# Patient Record
Sex: Female | Born: 1970 | Race: White | Hispanic: No | Marital: Married | Smoking: Current every day smoker
Health system: Southern US, Community
[De-identification: ages and names within clinical notes are randomized; demographics above are authoritative.]

## PROBLEM LIST (undated history)

## (undated) DIAGNOSIS — I639 Cerebral infarction, unspecified: Secondary | ICD-10-CM

---

## 1998-01-10 ENCOUNTER — Encounter: Admission: RE | Admit: 1998-01-10 | Discharge: 1998-01-10 | Payer: Self-pay | Admitting: Family Medicine

## 1999-06-11 ENCOUNTER — Other Ambulatory Visit: Admission: RE | Admit: 1999-06-11 | Discharge: 1999-06-11 | Payer: Self-pay | Admitting: Gynecology

## 1999-06-12 ENCOUNTER — Ambulatory Visit (HOSPITAL_COMMUNITY): Admission: RE | Admit: 1999-06-12 | Discharge: 1999-06-12 | Payer: Self-pay | Admitting: Gynecology

## 1999-06-12 ENCOUNTER — Encounter (INDEPENDENT_AMBULATORY_CARE_PROVIDER_SITE_OTHER): Payer: Self-pay

## 2000-05-25 ENCOUNTER — Inpatient Hospital Stay (HOSPITAL_COMMUNITY): Admission: AD | Admit: 2000-05-25 | Discharge: 2000-05-25 | Payer: Self-pay | Admitting: *Deleted

## 2000-05-30 ENCOUNTER — Inpatient Hospital Stay (HOSPITAL_COMMUNITY): Admission: AD | Admit: 2000-05-30 | Discharge: 2000-05-30 | Payer: Self-pay | Admitting: Gynecology

## 2000-06-04 ENCOUNTER — Inpatient Hospital Stay (HOSPITAL_COMMUNITY): Admission: AD | Admit: 2000-06-04 | Discharge: 2000-06-06 | Payer: Self-pay | Admitting: Gynecology

## 2000-06-18 ENCOUNTER — Other Ambulatory Visit: Admission: RE | Admit: 2000-06-18 | Discharge: 2000-06-18 | Payer: Self-pay | Admitting: Gynecology

## 2000-06-18 ENCOUNTER — Encounter (INDEPENDENT_AMBULATORY_CARE_PROVIDER_SITE_OTHER): Payer: Self-pay

## 2000-07-20 ENCOUNTER — Other Ambulatory Visit: Admission: RE | Admit: 2000-07-20 | Discharge: 2000-07-20 | Payer: Self-pay | Admitting: Gynecology

## 2001-11-08 ENCOUNTER — Other Ambulatory Visit: Admission: RE | Admit: 2001-11-08 | Discharge: 2001-11-08 | Payer: Self-pay | Admitting: Gynecology

## 2002-11-14 ENCOUNTER — Other Ambulatory Visit: Admission: RE | Admit: 2002-11-14 | Discharge: 2002-11-14 | Payer: Self-pay | Admitting: Gynecology

## 2004-01-12 ENCOUNTER — Other Ambulatory Visit: Admission: RE | Admit: 2004-01-12 | Discharge: 2004-01-12 | Payer: Self-pay | Admitting: Gynecology

## 2007-10-11 ENCOUNTER — Ambulatory Visit (HOSPITAL_COMMUNITY): Admission: RE | Admit: 2007-10-11 | Discharge: 2007-10-11 | Payer: Self-pay | Admitting: Obstetrics and Gynecology

## 2007-10-11 ENCOUNTER — Other Ambulatory Visit: Admission: RE | Admit: 2007-10-11 | Discharge: 2007-10-11 | Payer: Self-pay | Admitting: Obstetrics and Gynecology

## 2008-02-05 ENCOUNTER — Inpatient Hospital Stay (HOSPITAL_COMMUNITY): Admission: AD | Admit: 2008-02-05 | Discharge: 2008-02-05 | Payer: Self-pay | Admitting: Obstetrics and Gynecology

## 2008-03-24 ENCOUNTER — Inpatient Hospital Stay (HOSPITAL_COMMUNITY): Admission: AD | Admit: 2008-03-24 | Discharge: 2008-03-25 | Payer: Self-pay | Admitting: Obstetrics and Gynecology

## 2008-04-17 ENCOUNTER — Inpatient Hospital Stay (HOSPITAL_COMMUNITY): Admission: AD | Admit: 2008-04-17 | Discharge: 2008-04-20 | Payer: Self-pay | Admitting: Obstetrics and Gynecology

## 2008-05-12 ENCOUNTER — Inpatient Hospital Stay (HOSPITAL_COMMUNITY): Admission: AD | Admit: 2008-05-12 | Discharge: 2008-05-15 | Payer: Self-pay | Admitting: Obstetrics and Gynecology

## 2008-08-21 ENCOUNTER — Emergency Department (HOSPITAL_COMMUNITY): Admission: EM | Admit: 2008-08-21 | Discharge: 2008-08-21 | Payer: Self-pay | Admitting: Emergency Medicine

## 2008-08-24 ENCOUNTER — Encounter (HOSPITAL_COMMUNITY): Admission: RE | Admit: 2008-08-24 | Discharge: 2008-11-03 | Payer: Self-pay | Admitting: Emergency Medicine

## 2009-12-28 IMAGING — US US OB FOLLOW-UP
1 series · 14 of 28 positions shown · non-contrast
Comparison: none

OBSTETRICAL ULTRASOUND:
 This ultrasound exam was performed in the [HOSPITAL] Ultrasound Department.  The OB US report was generated in the AS system, and faxed to the ordering physician.  This report is also available in [REDACTED] PACS.

[Series 1: us fetal bpp w/o ns modify · non-contrast · 14 of 48 slices shown]
[im 2/48]
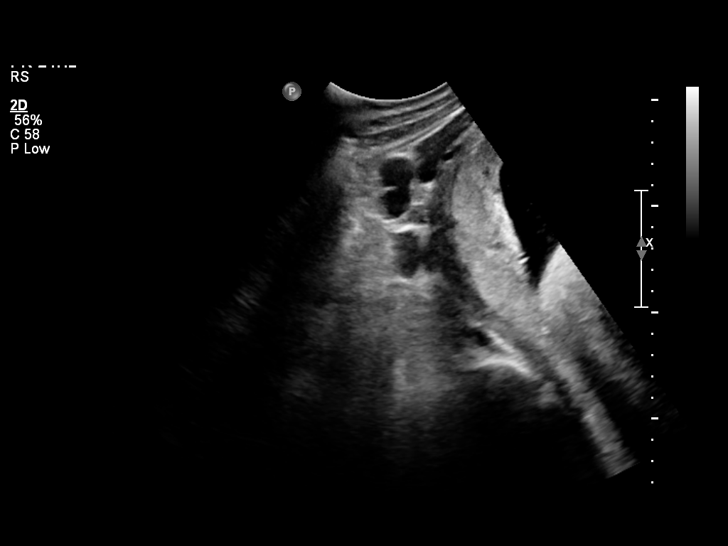
[im 6/48]
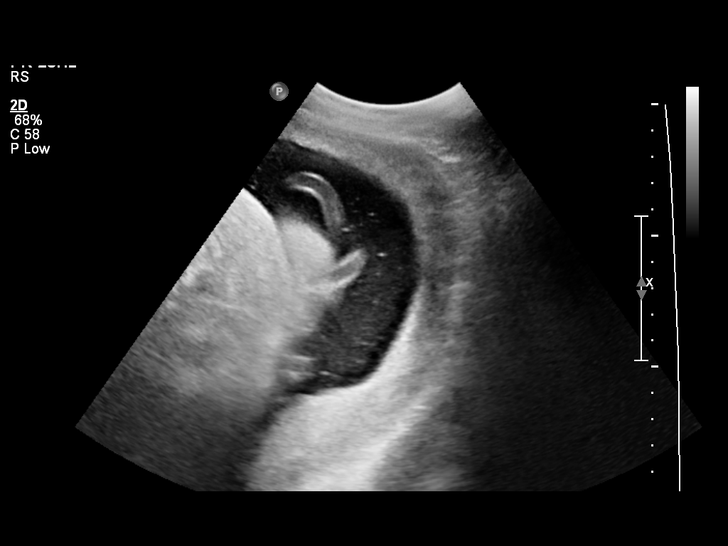
[im 9/48]
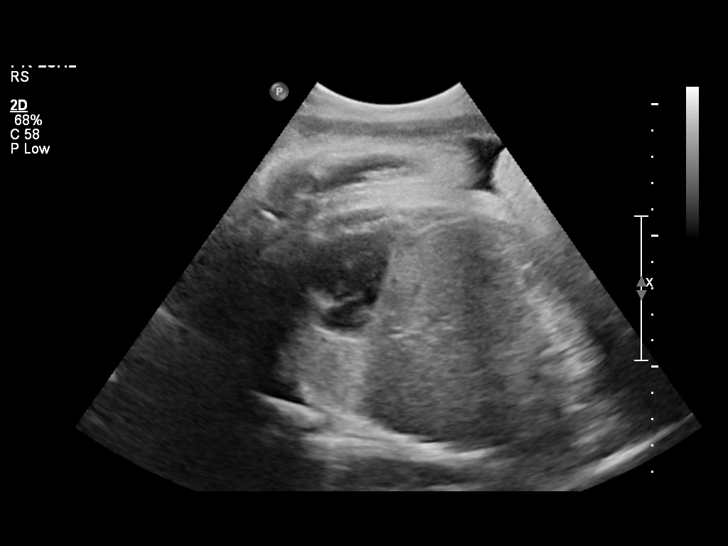
[im 13/48]
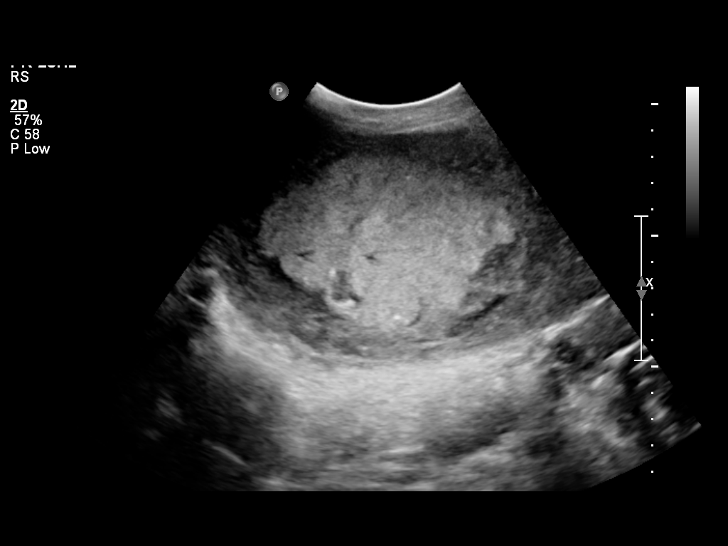
[im 16/48]
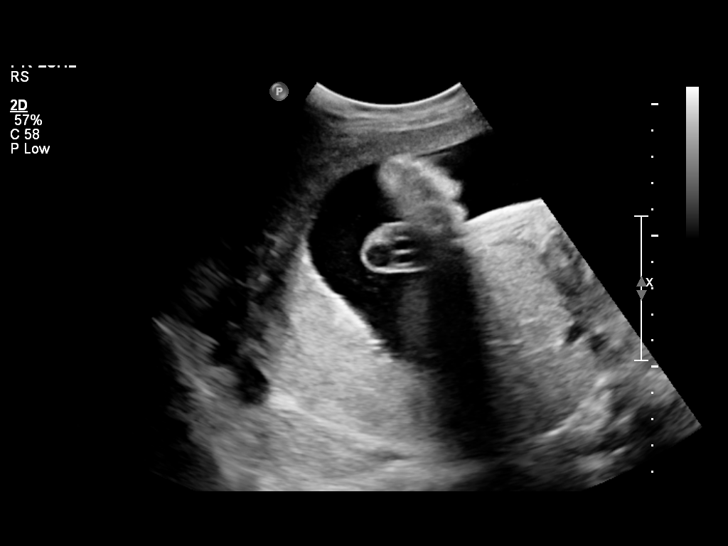
[im 20/48]
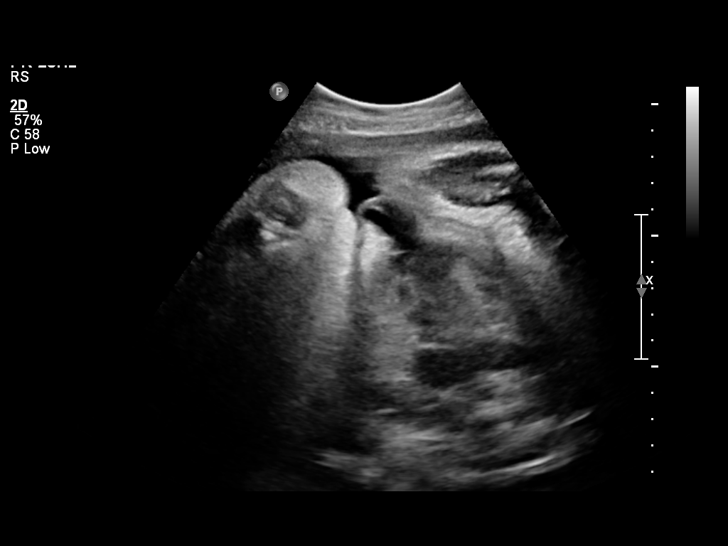
[im 23/48]
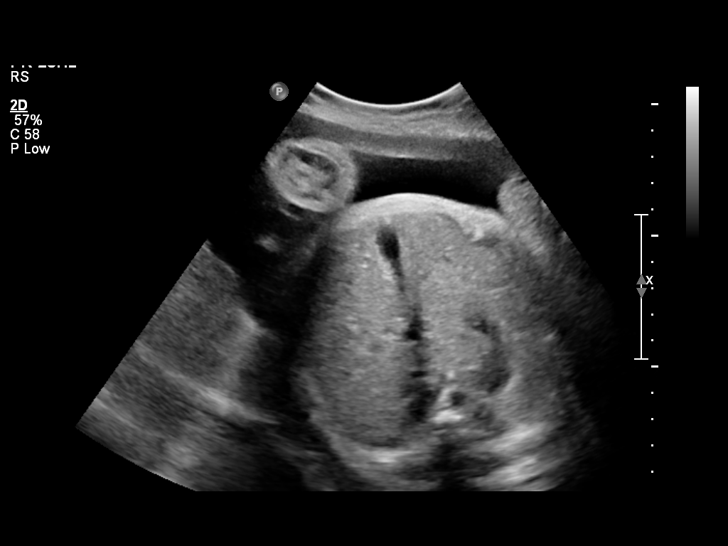
[im 27/48]
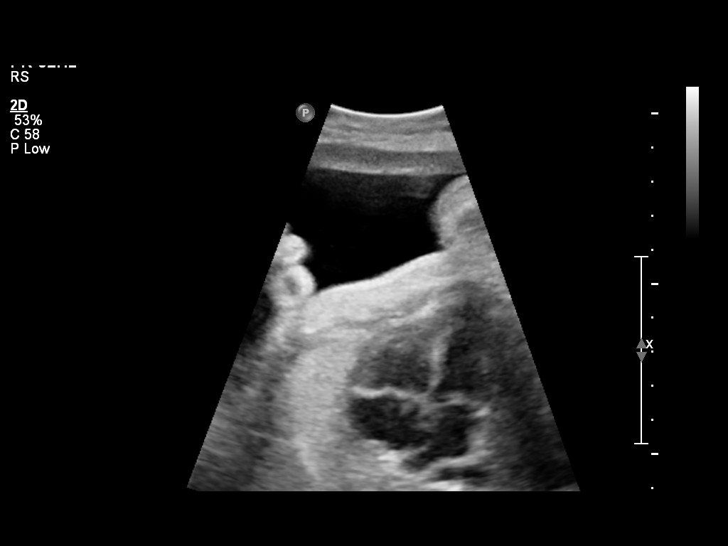
[im 30/48]
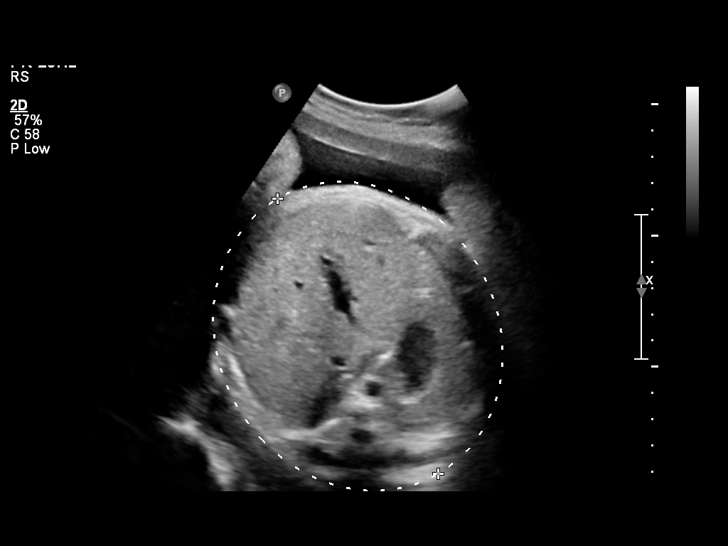
[im 34/48]
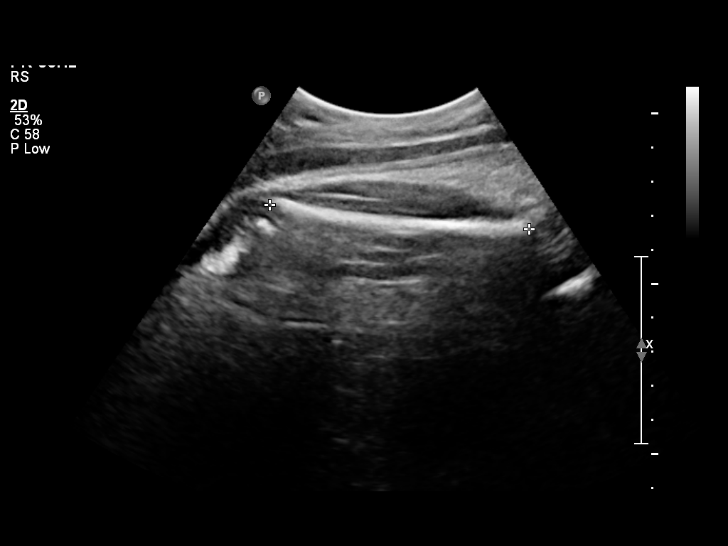
[im 37/48]
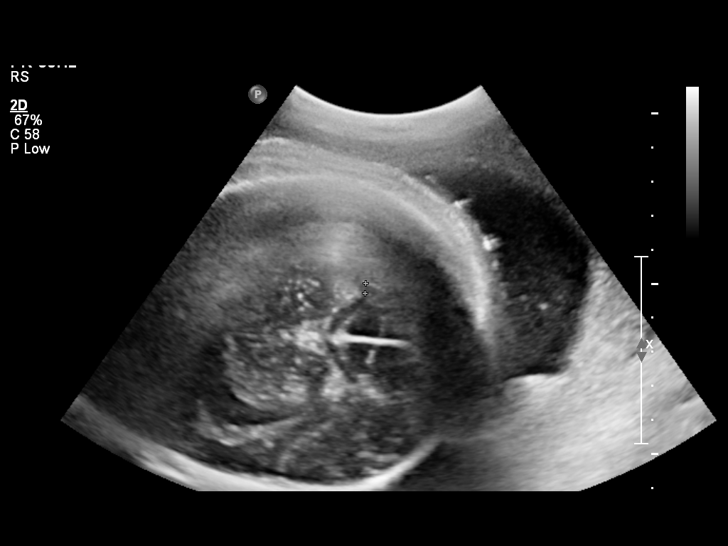
[im 41/48]
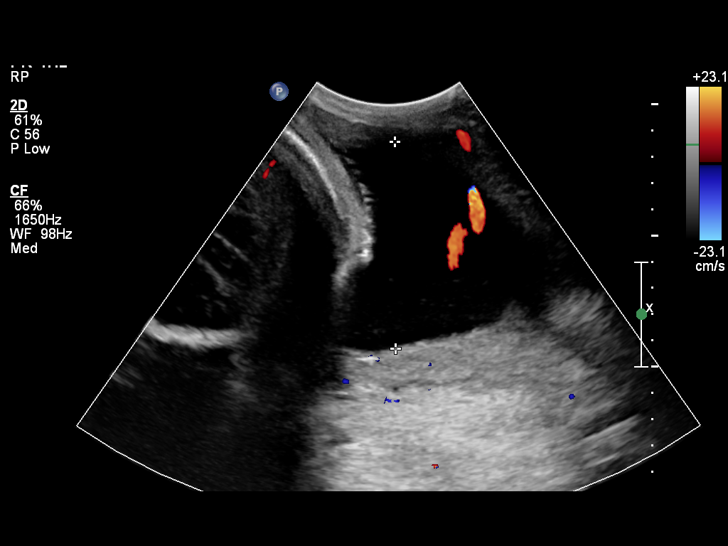
[im 44/48]
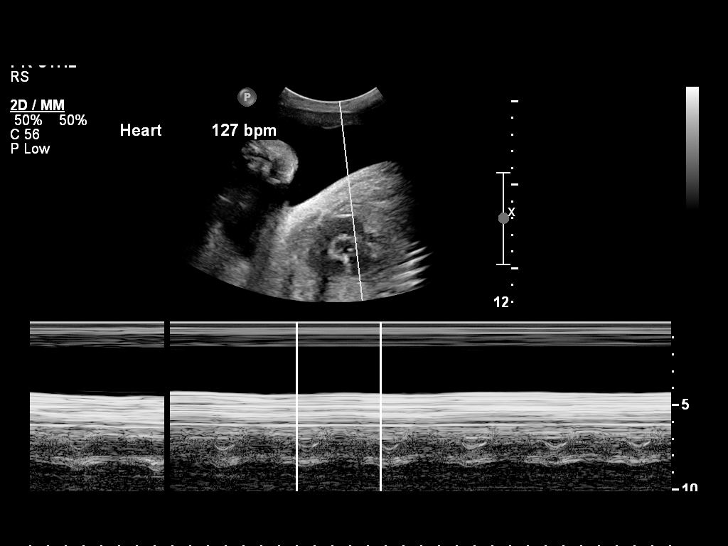
[im 48/48]
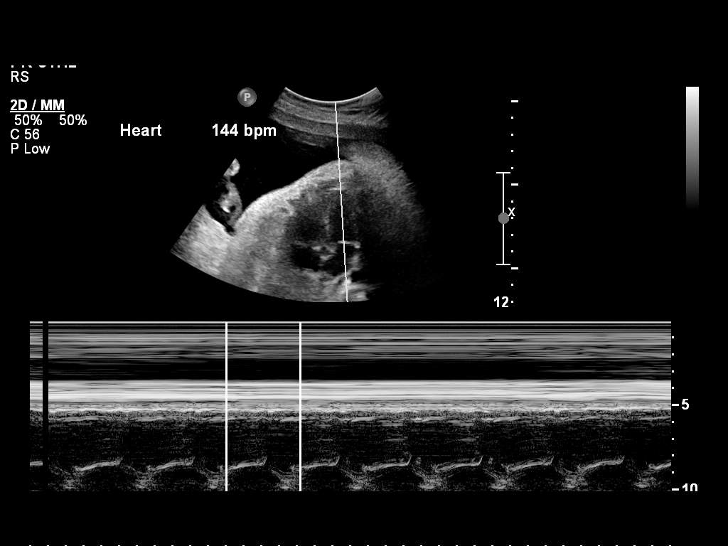

[14 of 28 positions shown; findings below may reference images not displayed]

IMPRESSION: See AS Obstetric US report.

## 2009-12-29 IMAGING — US US RENAL
1 series · 14 of 25 positions shown · non-contrast
Comparison: None

CLINICAL DATA: Pyelonephritis and flank pain

RENAL/URINARY TRACT ULTRASOUND
TECHNIQUE: Complete ultrasound examination of the urinary tract
was performed including evaluation of the kidneys, renal collecting
systems, and urinary bladder.

[Series 1: us renal · 14 of 70 slices shown]
[im 1/70]
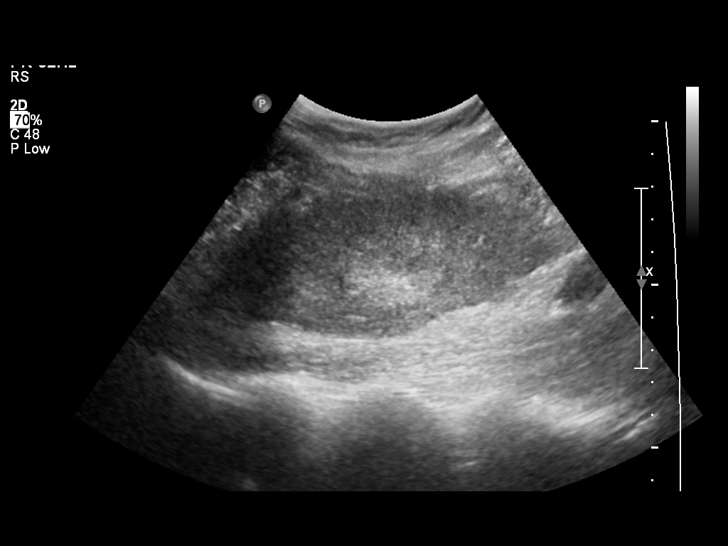
[im 6/70]
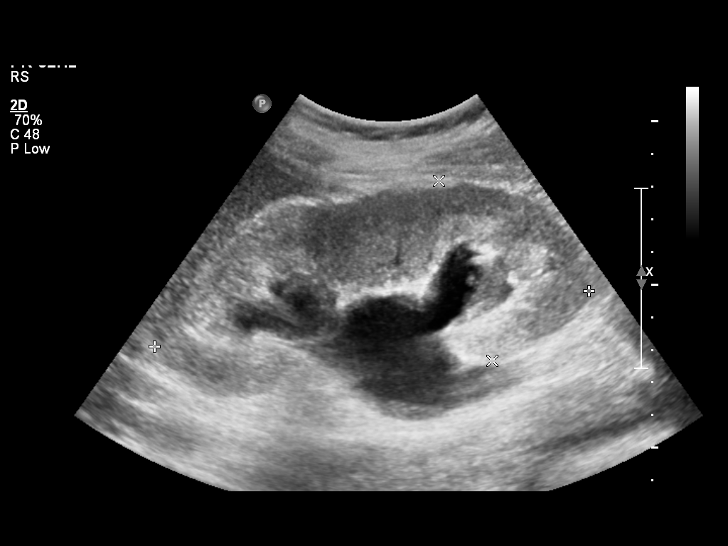
[im 12/70]
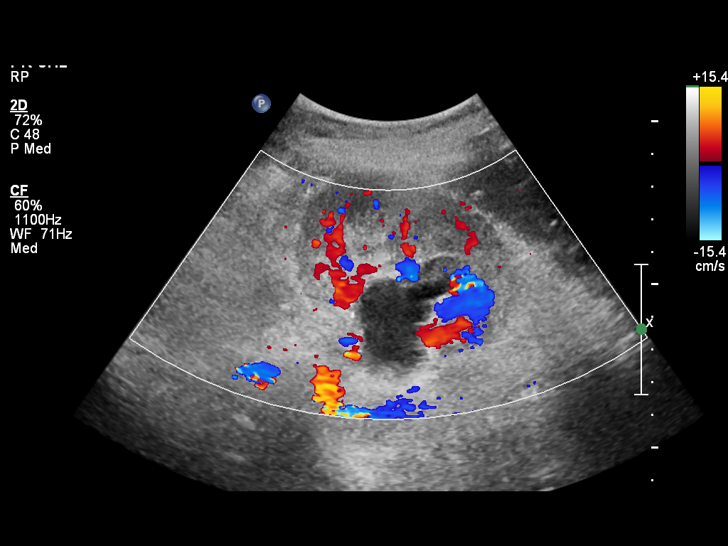
[im 18/70]
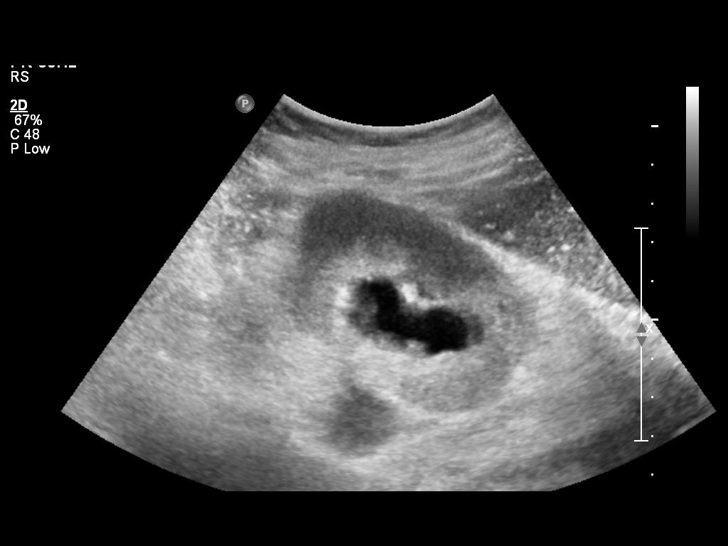
[im 24/70]
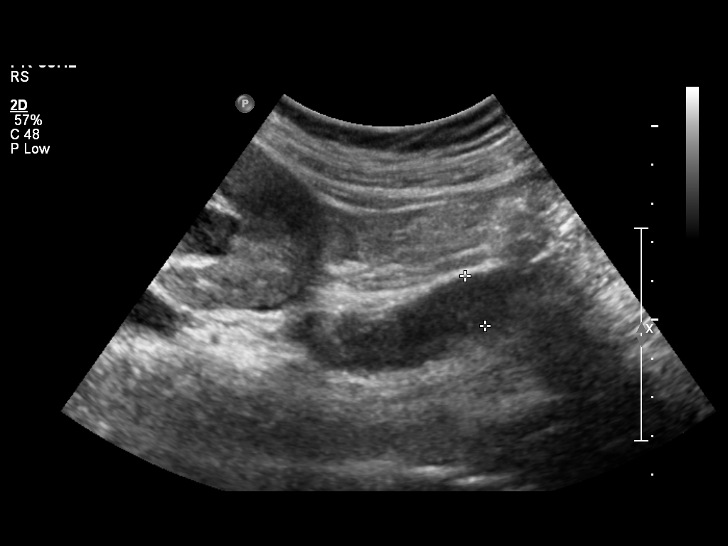
[im 26/70]
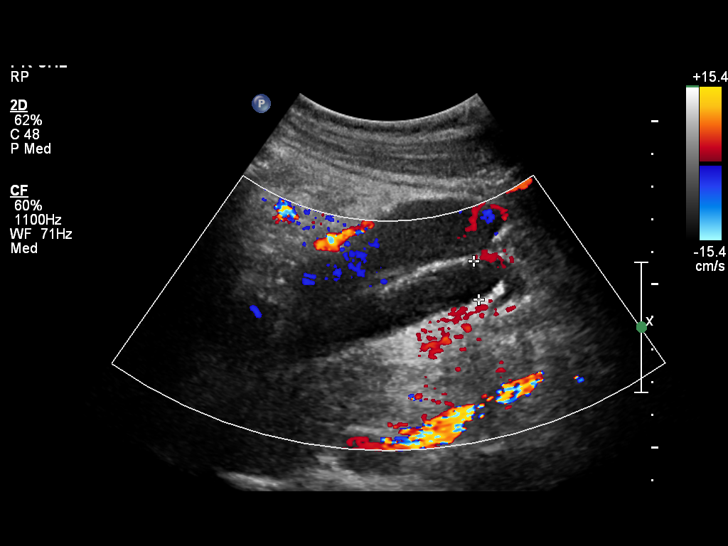
[im 32/70]
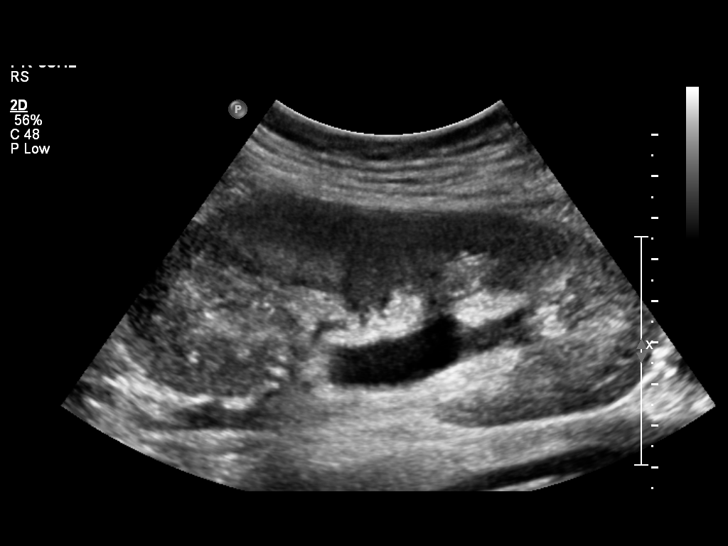
[im 38/70]
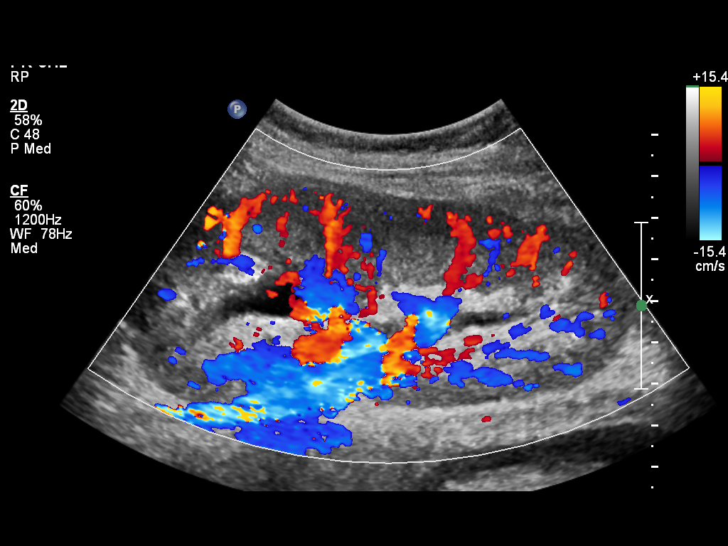
[im 44/70]
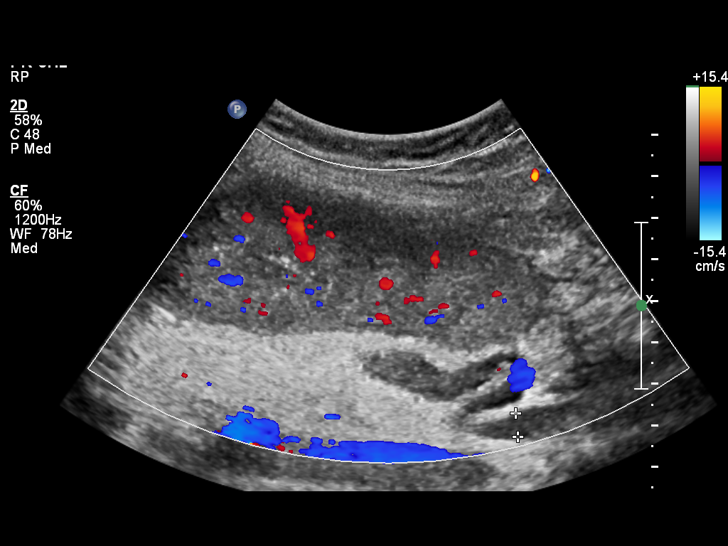
[im 47/70]
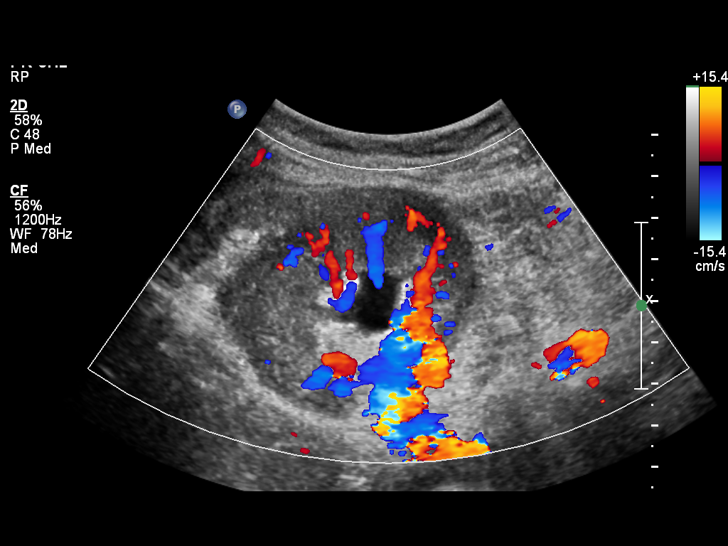
[im 52/70]
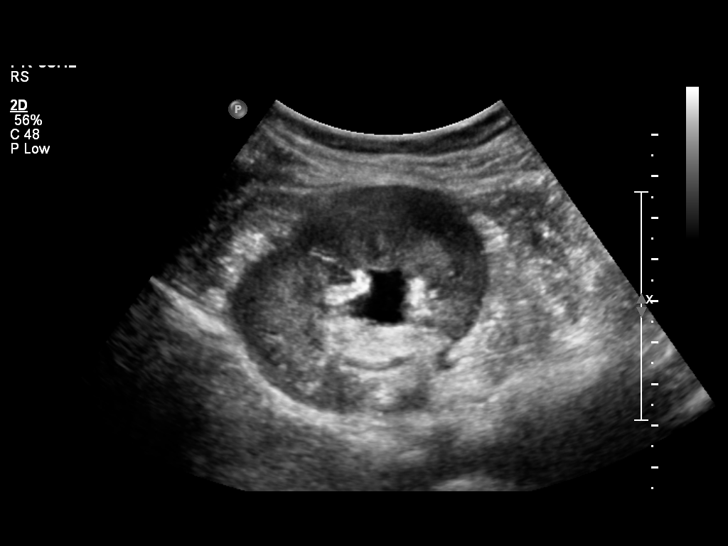
[im 58/70]
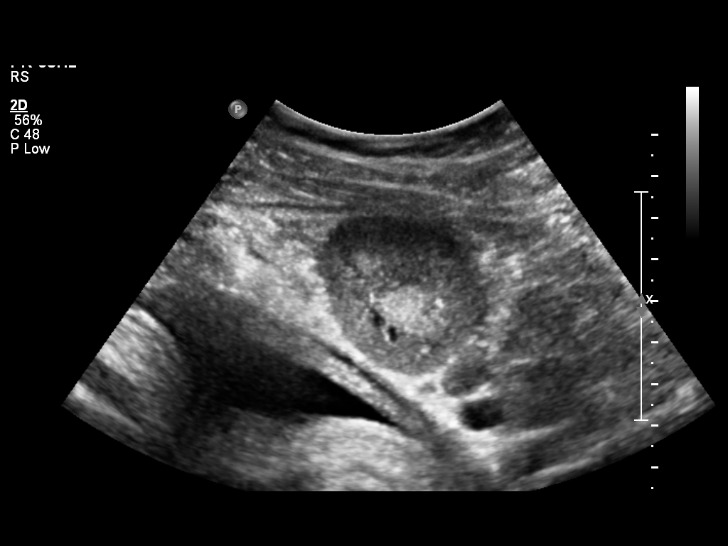
[im 64/70]
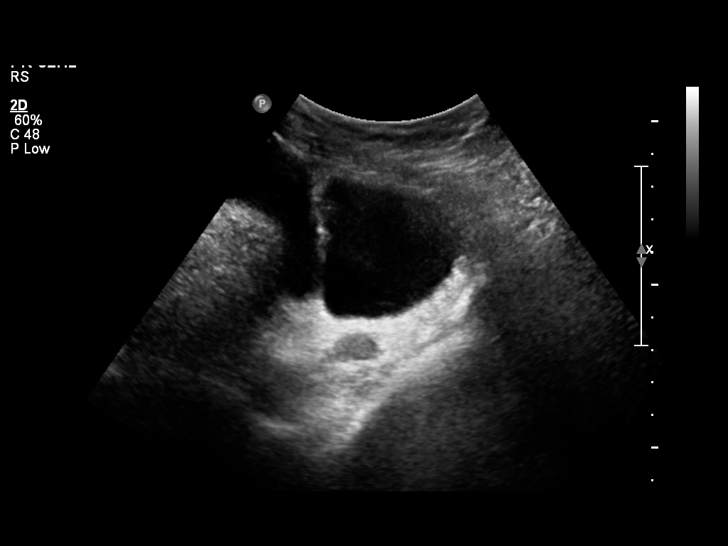
[im 70/70]
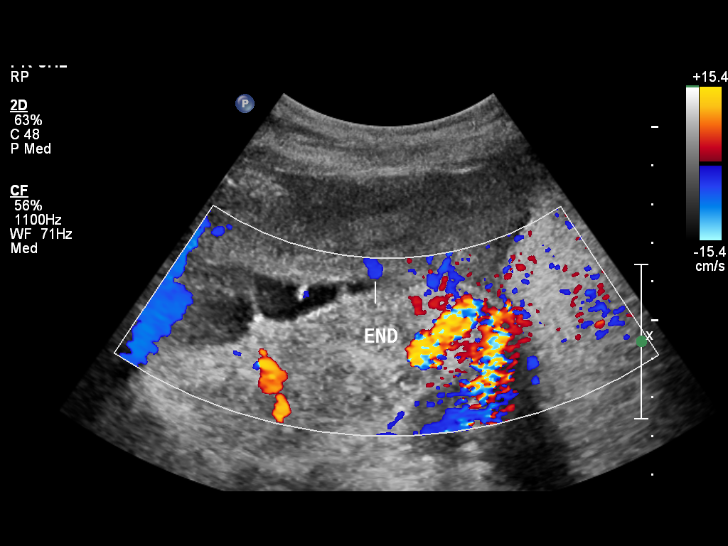

[14 of 25 positions shown; findings below may reference images not displayed]

FINDINGS: There is moderate right hydronephrosis and mild left
hydronephrosis which is not an uncommon finding in a gravid
patient.  Both ureters are also moderately dilated into the pelvis.
Layering debris  is seen within the right renal pelvis.  No
shadowing calculi are noted bilaterally.  There is no evidence of
peri nephric fluid or gross abscess bilaterally.  The kidneys
remain normal and symmetric in length, measuring 13.4 centimeters
on the right and 12.9 cm on the left.
IMPRESSION: Moderate right hydronephrosis and mild left hydronephrosis, which
is not an uncommon finding in pregnant patient; however, there is
layering debris within the right renal pelvis which is likely
related to the known pyelonephritis.  No perinephric fluid or
abscess are identified, however.

## 2010-03-26 ENCOUNTER — Other Ambulatory Visit: Payer: Self-pay | Admitting: Nurse Practitioner

## 2010-03-26 ENCOUNTER — Other Ambulatory Visit (HOSPITAL_COMMUNITY)
Admission: RE | Admit: 2010-03-26 | Discharge: 2010-03-26 | Disposition: A | Discharge status: Home / Self Care | Payer: BC Managed Care – PPO | Source: Ambulatory Visit | Attending: Obstetrics and Gynecology | Admitting: Obstetrics and Gynecology

## 2010-03-26 DIAGNOSIS — Z1159 Encounter for screening for other viral diseases: Secondary | ICD-10-CM | POA: Insufficient documentation

## 2010-03-26 DIAGNOSIS — Z01419 Encounter for gynecological examination (general) (routine) without abnormal findings: Secondary | ICD-10-CM | POA: Insufficient documentation

## 2010-03-26 DIAGNOSIS — Z113 Encounter for screening for infections with a predominantly sexual mode of transmission: Secondary | ICD-10-CM | POA: Insufficient documentation

## 2010-05-22 LAB — CBC
HCT: 27.8 % — ABNORMAL LOW (ref 36.0–46.0)
Hemoglobin: 10.4 g/dL — ABNORMAL LOW (ref 12.0–15.0)
Hemoglobin: 9.3 g/dL — ABNORMAL LOW (ref 12.0–15.0)
MCV: 79.8 fL (ref 78.0–100.0)
MCV: 80.1 fL (ref 78.0–100.0)
RBC: 3.92 MIL/uL (ref 3.87–5.11)
RDW: 15.4 % (ref 11.5–15.5)
WBC: 8.2 10*3/uL (ref 4.0–10.5)
WBC: 9.1 10*3/uL (ref 4.0–10.5)

## 2010-05-22 LAB — RPR: RPR Ser Ql: NONREACTIVE

## 2010-05-23 LAB — BASIC METABOLIC PANEL
BUN: 5 mg/dL — ABNORMAL LOW (ref 6–23)
CO2: 20 mEq/L (ref 19–32)
Calcium: 8.1 mg/dL — ABNORMAL LOW (ref 8.4–10.5)
Chloride: 101 mEq/L (ref 96–112)
Creatinine, Ser: 0.85 mg/dL (ref 0.4–1.2)
GFR calc Af Amer: >60 mL/min (ref >60)
GFR calc non Af Amer: >60 mL/min (ref >60)
Glucose, Bld: 129 mg/dL — ABNORMAL HIGH (ref 70–99)
Potassium: 3.2 mEq/L — ABNORMAL LOW (ref 3.5–5.1)
Sodium: 129 mEq/L — ABNORMAL LOW (ref 135–145)

## 2010-05-28 LAB — URINE MICROSCOPIC-ADD ON

## 2010-05-28 LAB — CBC
HCT: 30.2 % — ABNORMAL LOW (ref 36.0–46.0)
MCHC: 32.6 g/dL (ref 30.0–36.0)
MCV: 83.7 fL (ref 78.0–100.0)
Platelets: 195 10*3/uL (ref 150–400)
RDW: 13.5 % (ref 11.5–15.5)

## 2010-05-28 LAB — URINALYSIS, ROUTINE W REFLEX MICROSCOPIC
Glucose, UA: NEGATIVE mg/dL
Specific Gravity, Urine: 1.015 (ref 1.005–1.030)
Urobilinogen, UA: 0.2 mg/dL (ref 0.0–1.0)

## 2010-05-28 LAB — COMPREHENSIVE METABOLIC PANEL
Albumin: 2.2 g/dL — ABNORMAL LOW (ref 3.5–5.2)
BUN: 10 mg/dL (ref 6–23)
Calcium: 8.6 mg/dL (ref 8.4–10.5)
Creatinine, Ser: 1.2 mg/dL (ref 0.4–1.2)
Total Bilirubin: 0.6 mg/dL (ref 0.3–1.2)
Total Protein: 5.9 g/dL — ABNORMAL LOW (ref 6.0–8.3)

## 2010-06-25 NOTE — H&P (Signed)
[name], [name]                  ACCOUNT NO.:  [id number]   MEDICAL RECORD NO.:  [id number]          PATIENT TYPE:  INP   LOCATION:  9159                          FACILITY:  [facility]   PHYSICIAN:  Charles A. Delcambre, MDDATE OF BIRTH:  02-09-1971   DATE OF ADMISSION:  04/17/2008  DATE OF DISCHARGE:  04/20/2008                              HISTORY & PHYSICAL   She is to be admitted on the evening of May 13, 2008, to undergo  Cytotec ripening of the cervix and Pitocin induction on Sunday, May 14, 2008.  She will be 9 days overdue upon admission.  She is a 40 year old  white female, mixed couple, African American husband, the patient is  gravida 6, para 2-0-3-2.  Recent ultrasound today shows biophysical of  10/10 and estimated fetal weight 8 pounds 8 ounces, 3878 g, all to be  greater than 98th percentile.  Amniotic fluid at 24.1 borderline  polyhydramnios.  She notes active fetal movement.  Denies rupture of  membranes or bleeding at this point.   PAST MEDICAL HISTORY:  Pyelonephritis during this pregnancy, asthma,  albuterol inhaler used infrequently, tubal pregnancy, and ovarian cyst  removed at one point.   PAST SURGICAL HISTORY:  As noted.  SVD x2 otherwise.   MEDICATIONS:  1. Claritin p.r.n.  2. Macrobid 100 mg once a day.  3. Prenatal vitamins.  4. Albuterol inhaler rarely.   ALLERGIES:  No known drug allergies.   PHYSICAL EXAMINATION:  VITAL SIGNS:  Heart rate on ultrasound and NST  130s with reactive tracing accelerations, no decelerations.  Blood  pressure 130/80, urine negative.  Negative for protein and sugar.  Weight 167 pounds a day.  Today, she is 40 weeks and 4 days estimated  gestational age.  LUNGS:  Clear bilaterally.  BACK:  No CVAT.  HEART:  Regular rate and rhythm 2/6 systolic ejection murmur at the left  sternal border.  PELVIS:  Cervix 1.5, 50 at most -2, vertex intact.   PRENATAL LABORATORY DATA:  First trimester screen 1 in 59 Down's risk.  She  followed with did and declined amniocentesis with normal level 2  ultrasound.  Other labs; cystic fibrosis negative, hepatitis B surface  antigen negative, HIV nonreactive at 36 weeks as well as early in the  pregnancy.  VDRL nonreactive as well as at 28 weeks, sickle trait is  positive, antibody screen negative, A+ blood type, TSH was normal, Pap  was negative.  Gonorrhea and chlamydia were negative.  One-hour Glucola  was 97.  Hemoglobin was 9.2 at that 28 weeks and she has been instructed  to take iron in addition to her vitamin.  Group B strep was positive.   ASSESSMENT:  Intrauterine pregnancy to be 40 weeks and 9 days upon  admission.  Will undergo Cytotec ripening of the cervix and Pitocin  induction.  She gives informed consent.  All questions were answered.      Charles A. Sydnee Cabal, MD  Electronically Signed     CAD/MEDQ  D:  05/08/2008  T:  05/09/2008  Job:  [id number]

## 2010-06-25 NOTE — H&P (Signed)
NAMEBERNISE, [name] NO.:  [id number]   MEDICAL RECORD NO.:  [id number]        PATIENT TYPE:  WINP   LOCATION:                                FACILITY:  [facility]   PHYSICIAN:  Gerald Leitz, MD          DATE OF BIRTH:  Jun 22, 1970   DATE OF ADMISSION:  04/17/2008  DATE OF DISCHARGE:                              HISTORY & PHYSICAL   CHIEF COMPLAINT:  Back pain and UTI.   CURRENT HISTORY:  This is a 40 year old G6, P 2-0-3-2 at 37 weeks and 4  days intrauterine pregnancy based on a 7-week ultrasound with estimated  date of delivery May 04, 2008.  Pregnancy complicated by history of  urinary tract infection as well as asthma.  The patient presented to the  office complaining of severe back pain, nausea and vomiting.  Last  episode of emesis was on April 16, 2008 as well as decreased fetal  movement.  She denies any regular contractions.  No vaginal bleeding.  No leakage of fluid.   PAST MEDICAL HISTORY:  Asthma.   PAST OBSTETRICAL HISTORY:  1. Spontaneous vaginal delivery x2.  2. Ectopic pregnancy in 1993.  3. Miscarriage in 1992 and 2000.   PAST GYNECOLOGICAL HISTORY:  Negative for any sexually transmitted  diseases.  Last Pap smear October 11, 2007 was normal.   PAST SURGICAL HISTORY:  1. Ovarian cystectomy in 1992.  2. Surgery for ectopic pregnancy in 1993.   MEDICATIONS:  Prenatal vitamins, Claritin p.r.n., albuterol, Tylenol.   ALLERGIES:  No known drug allergies.   FAMILY HISTORY:  Noncontributory.   SOCIAL HISTORY:  The patient is married.  She smokes cigarettes  approximately two to three daily.  No alcohol or illicit drug use during  pregnancy.   EXAMINATION:  VITAL SIGNS:  Blood pressure 112/70, weight 179 pounds,  temperature 98.3.  GENERAL:  Alert and oriented, ill-appearing female no acute distress.  CARDIOVASCULAR:  Regular rate and rhythm.  LUNGS:  Clear to auscultation bilaterally.  ABDOMEN:  Gravid and nontender.  Positive left CVA  tenderness greater  than right.  EXTREMITIES:  No clubbing, cyanosis or edema.  CERVIX:  1, thick and high.  Fetal heart rate baseline 160s,  nonreactive.  TOCO:  No contractions.   1. Urinalysis showed 3+ leukocyte esterase, nitrites are positive,      protein 2+, white blood cells too numerous to count.  Epithelials      few, bacteria abundant, red blood cells too numerous to count.  2. CBC:  Hemoglobin of 10.3, hematocrit 30.8.  White blood cell count      9.9 and platelets are 220.   IMPRESSION AND PLAN:  37 and 4 weeks with pyelonephritis.  We will admit  to the hospital for intravenous antibiotics as well as anti-emetics.  Given her nonreactive non-stress test in the office, we will also order  an ultrasound to check for a biophysical profile, an estimated fetal  weight.  Recommend an ultrasound to look for perinephric abscess if the  patient does not improve on intravenous antibiotics.  She has been  ordered Rocephin as well as gentamicin.      Gerald Leitz, MD  Electronically Signed     TC/MEDQ  D:  04/17/2008  T:  04/17/2008  Job:  [id number]

## 2010-06-25 NOTE — Discharge Summary (Signed)
[name], [name]                  ACCOUNT NO.:  [id number]   MEDICAL RECORD NO.:  [id number]          PATIENT TYPE:  INP   LOCATION:  9159                          [facility]ACILITY:  [facility]   PHYSICIAN:  Charles A. Delcambre, MDDATE OF BIRTH:  07/13/1970   DATE OF ADMISSION:  04/17/2008  DATE OF DISCHARGE:  04/20/2008                               DISCHARGE SUMMARY   PRIMARY DISCHARGE DIAGNOSES:  1. Intrauterine pregnancy at 38 weeks and 0 days.  2. Pyelonephritis.   Labs:  U/A consistent with TNTC wbc's and culture returned klebsiella  not sensitive to Macrodantin.   Path: n/a   H&P:  See note in chart.   Hospital course:  She was admitted and started on Cefotan and  Gentamycin.  She had Tm 103 at admission, the 101 day 2.  She was kept  on IV antibiotics until she was afebrile for 48 hours.  There were some  decelerations on the FHR monitor, so she underwent a CST.  Results were  reasssuring and she was discharged home.  NST's and BPPs are scheduled  two times a week respectively.   DISPOSITION:  The patient discharged to return to the office.  She will  continue 100 mg a day of Macrobid for suppression.  She was admitted and  placed on Rocephin and gentamicin.  On hospital day #1, she had a T-max  of 101.4.  She was continued on medications and fetal monitoring and had  a T-max on hospital day #2 of 99.7.  She did have some long, subtle  decelerations with 1 or 2 contractions per hour, not recurrent but then  fetal heart rate was immediately reactive.  We did proceed with a CST  nipple stimulation.  After 45 minutes, this failed to produce the  desired contraction effect.  She was then given Pitocin to achieve a  CST.  Baby was known breech at that time and she was placed at n.p.o.  status.  She gave informed consent.  Then if she had late decelerations,  we will proceed with cesarean section.  She did not have late  decelerations, but had a reactive tracing, tolerated well, so  therefore  with a negative reactive CST, she was maintained in-house for 24 more  hours without further decelerations and with no further fever.  She was  discharged home on Macrobid 7 days b.i.d. and then go to daily.  Ultrasound for growth and position, and BPP, NST was scheduled for May 01, 2008.  She was discharged home with followup as noted above.  She  will continue taking prenatal vitamins.      Charles A. Sydnee Cabal, MD  Electronically Signed     CAD/MEDQ  D:  05/06/2008  T:  05/07/2008  Job:  [id number]

## 2010-06-28 NOTE — Op Note (Signed)
[facility]  Patient:    [name], [name]                           MRN: [id number] Proc. Date: 06/12/99 Adm. Date:  [id number] Attending:  Douglass Rivers                           Operative Report  PREOPERATIVE DIAGNOSIS:       Missed abortion at 9+ weeks.  POSTOPERATIVE DIAGNOSIS:      Missed abortion at 9+ weeks.  OPERATION:                    First trimester D&E.  SURGEON:                      Douglass Rivers, M.D.  ASSISTANT:  ANESTHESIA:                   MAC and paracervical block.  ESTIMATED BLOOD LOSS:         Minimal.  FINDINGS:                     A retroverted uterus at 10 to 12 weeks size, normal cavity contour.  PATHOLOGY:                    Uterine contents.  DESCRIPTION OF PROCEDURE:     The patient was taken to the operating room and IV sedation was induced and placed in the dorsal lithotomy position.  She was prepped and draped in the usual sterile fashion.  Bivalve speculum was placed in the vagina. The cervix was visualized and paracervical block was placed.  The bivalve was then removed.  Orientation of the uterus was confirmed.  The bivalve was then replaced.  The cervix was stabilized with a single tooth tenaculum and gently dilated up to #29 [place].  A #9 suction curet was then advanced through the cervix toward the fundus.  Suction was obtained.  The uterus was cleared of its contents. A gentle curettage afterwards confirmed good uterine cry throughout.  The patient tolerated the procedure well.  The instruments were removed from the vagina. Sponge, needle, and instrument counts were correct.  She was given 0.2 mg of Methergine IM at the end of the procedure. DD:  06/12/99 TD:  06/13/99 Job: [id number]

## 2010-06-28 NOTE — Discharge Summary (Signed)
[facility]  Patient:    [name], [name]                           MRN: [id number] Adm. Date:  [id number] Disc. Date: [id number] Attending:  Merrily Pew Dictator:   Antony Contras, [facility]                           Discharge Summary  DISCHARGE DIAGNOSES:          1. Intrauterine pregnancy at term.                               2. History of positive Group B streptococcus.                               3. History of sickle cell trait.  PROCEDURES:                   Normal spontaneous vaginal delivery of                               viable infant over an intact perineum.  HISTORY OF PRESENT ILLNESS:   The patient is a 40 year old, gravida 5, para 1-0-3-1 with an LMP of August 30, 1999 and [facility] June 01, 2000 by sonogram. Prenatal risk factors include a history of sickle cell trait. The father had a normal hemoglobin electrophoresis. Also, a history of positive GBS.  OTHER PRENATAL LABORATORIES:  Blood type A positive, antibody screen negative. RPR, HBsAg, HIV nonreactive. MSAFP normal.  HOSPITAL COURSE/TREATMENT:    The patient was admitted on June 05, 2000 with spontaneous rupture of membranes and onset of regular uterine contractions. On admission, her cervix was 2 cm. The patient rapidly progressed to complete dilatation and delivered an Apgar 9/9 female infant weighing 8 pounds 6 ounces over an intact perineum. Cord blood was collected for banking. There was a nuchal cord x 1.  Postpartum, the patient remained afebrile, had no difficulty voiding, and requested discharge on her first postpartum day. CBC: Hematocrit 25.2, hemoglobin 8.4, WBCs 9.6, platelets 191,000.  DISCHARGE FOLLOWUP:           The patient is to followup in six weeks.  DISCHARGE MEDICATIONS:        The patient is to continue with prenatal vitamins and iron, and Motrin and Tylox for pain. DD:  06/29/00 TD:  06/29/00 Job: [id number]

## 2010-11-14 LAB — URINALYSIS, ROUTINE W REFLEX MICROSCOPIC
Glucose, UA: NEGATIVE mg/dL
Ketones, ur: NEGATIVE mg/dL
Protein, ur: 30 mg/dL — AB

## 2010-11-14 LAB — URINE MICROSCOPIC-ADD ON

## 2013-08-24 ENCOUNTER — Encounter (HOSPITAL_COMMUNITY): Payer: Self-pay | Admitting: Emergency Medicine

## 2013-08-24 ENCOUNTER — Emergency Department (INDEPENDENT_AMBULATORY_CARE_PROVIDER_SITE_OTHER)
Admission: EM | Admit: 2013-08-24 | Discharge: 2013-08-24 | Disposition: A | Discharge status: Home / Self Care | Payer: Self-pay | Source: Home / Self Care | Attending: Family Medicine | Admitting: Family Medicine

## 2013-08-24 DIAGNOSIS — N390 Urinary tract infection, site not specified: Secondary | ICD-10-CM

## 2013-08-24 HISTORY — DX: Cerebral infarction, unspecified: I63.9

## 2013-08-24 LAB — POCT URINALYSIS DIP (DEVICE)
Bilirubin Urine: NEGATIVE
Glucose, UA: NEGATIVE mg/dL
Ketones, ur: NEGATIVE mg/dL
NITRITE: NEGATIVE
PH: 6.5 (ref 5.0–8.0)
Protein, ur: NEGATIVE mg/dL
Specific Gravity, Urine: <=1.005 (ref 1.005–1.030)
UROBILINOGEN UA: 0.2 mg/dL (ref 0.0–1.0)

## 2013-08-24 LAB — POCT PREGNANCY, URINE: Preg Test, Ur: NEGATIVE

## 2013-08-24 MED ORDER — CEPHALEXIN 500 MG PO CAPS: 500.0000 mg | ORAL_CAPSULE | Freq: Four times a day (QID) | ORAL | Status: AC

## 2013-08-24 NOTE — ED Notes (Signed)
C/o urinary incontinence.  Urgency.  Lower back pain. concentrated urine.  Symptoms present x 1 wk.  Denies fever, n/v/d  No otc treatments tried for symptoms.

## 2013-08-24 NOTE — Discharge Instructions (Signed)
Take all of medicine as directed, drink lots of fluids, see your doctor if further problems. °

## 2013-08-24 NOTE — ED Provider Notes (Signed)
CSN: [id number]     Arrival date & time 08/24/13  1659 History   First MD Initiated Contact with Patient 08/24/13 1734     Chief Complaint  Patient presents with  . Urinary Tract Infection   (Consider location/radiation/quality/duration/timing/severity/associated sxs/prior Treatment) Patient is a 43 y.o. female presenting with urinary tract infection. The history is provided by the patient.  Urinary Tract Infection This is a new problem. The current episode started more than 1 week ago. The problem has been gradually worsening. Associated symptoms include abdominal pain.    Past Medical History  Diagnosis Date  . Stroke    History reviewed. No pertinent past surgical history. History reviewed. No pertinent family history. History  Substance Use Topics  . Smoking status: Current Every Day Smoker -- 0.50 packs/day    Types: Cigarettes  . Smokeless tobacco: Not on file  . Alcohol Use: No   OB History   Grav Para Term Preterm Abortions TAB SAB Ect Mult Living                 Review of Systems  Constitutional: Negative.  Negative for fever and chills.  Gastrointestinal: Positive for abdominal pain. Negative for nausea and vomiting.  Genitourinary: Positive for dysuria, urgency, frequency and pelvic pain. Negative for vaginal bleeding and vaginal discharge.    Allergies  Review of patient's allergies indicates no known allergies.  Home Medications   Prior to Admission medications   Medication Sig Start Date End Date Taking? Authorizing Provider  levothyroxine (SYNTHROID) 75 MCG tablet Take 75 mcg by mouth daily before breakfast.   Yes Historical Provider, MD  cephALEXin (KEFLEX) 500 MG capsule Take 1 capsule (500 mg total) by mouth 4 (four) times daily. Take all of medicine and drink lots of fluids 08/24/13   Linna HoffJames D Kindl, MD   BP 105/71  Pulse 91  Temp(Src) 98.9 F (37.2 C) (Oral)  Resp 16  SpO2 100%  LMP 08/10/2013 Physical Exam  Nursing note and vitals  reviewed. Constitutional: She is oriented to person, place, and time. She appears well-developed and well-nourished.  Abdominal: Soft. Bowel sounds are normal. There is tenderness in the suprapubic area. There is no rigidity, no rebound, no guarding and no CVA tenderness.  Neurological: She is alert and oriented to person, place, and time.  Skin: Skin is warm and dry.    ED Course  Procedures (including critical care time) Labs Review Labs Reviewed  POCT URINALYSIS DIP (DEVICE) - Abnormal; Notable for the following:    Hgb urine dipstick TRACE (*)    Leukocytes, UA SMALL (*)    All other components within normal limits  POCT PREGNANCY, URINE    Imaging Review No results found.   MDM   1. UTI (lower urinary tract infection)        Linna HoffJames D Kindl, MD 08/24/13 1757

## 2013-08-26 ENCOUNTER — Telehealth (HOSPITAL_COMMUNITY): Payer: Self-pay | Admitting: Family Medicine

## 2013-08-26 MED ORDER — CIPROFLOXACIN HCL 500 MG PO TABS: 500.0000 mg | ORAL_TABLET | Freq: Two times a day (BID) | ORAL | Status: AC

## 2013-08-26 NOTE — ED Notes (Signed)
Patient developed vomiting and diarrhea on 7/15 after starting keflex.  She stopped the medication and felt better. The symptoms returned when she restarted keflex.  She would like to switch to another medication.  No abdominal pain or fever.  Will send in Cipro  Rodolph BongEvan S Rhylan Gross, MD 08/26/13 [phone]

## 2014-11-30 DIAGNOSIS — D509 Iron deficiency anemia, unspecified: Secondary | ICD-10-CM

## 2015-09-21 DIAGNOSIS — D509 Iron deficiency anemia, unspecified: Secondary | ICD-10-CM | POA: Diagnosis not present

## 2015-12-13 DIAGNOSIS — D509 Iron deficiency anemia, unspecified: Secondary | ICD-10-CM | POA: Diagnosis not present

## 2015-12-13 DIAGNOSIS — D573 Sickle-cell trait: Secondary | ICD-10-CM | POA: Diagnosis not present
